# Patient Record
Sex: Female | Born: 1995 | Hispanic: No | Marital: Single | Smoking: Never smoker
Health system: Southern US, Community
[De-identification: ages and names within clinical notes are randomized; demographics above are authoritative.]

## PROBLEM LIST (undated history)

## (undated) DIAGNOSIS — D179 Benign lipomatous neoplasm, unspecified: Secondary | ICD-10-CM

## (undated) HISTORY — DX: Benign lipomatous neoplasm, unspecified: D17.9

---
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
## 2017-09-08 ENCOUNTER — Encounter: Payer: Self-pay | Admitting: Physician Assistant

## 2017-09-08 ENCOUNTER — Other Ambulatory Visit: Payer: Self-pay

## 2017-09-08 ENCOUNTER — Ambulatory Visit: Payer: Self-pay | Admitting: Physician Assistant

## 2017-09-08 ENCOUNTER — Ambulatory Visit (INDEPENDENT_AMBULATORY_CARE_PROVIDER_SITE_OTHER): Payer: Self-pay

## 2017-09-08 VITALS — BP 122/54 | HR 79 | Temp 98.8°F | Wt 162.2 lb

## 2017-09-08 DIAGNOSIS — R040 Epistaxis: Secondary | ICD-10-CM

## 2017-09-08 DIAGNOSIS — R042 Hemoptysis: Secondary | ICD-10-CM

## 2017-09-08 DIAGNOSIS — J019 Acute sinusitis, unspecified: Secondary | ICD-10-CM

## 2017-09-08 DIAGNOSIS — R519 Headache, unspecified: Secondary | ICD-10-CM

## 2017-09-08 DIAGNOSIS — R51 Headache: Secondary | ICD-10-CM

## 2017-09-08 DIAGNOSIS — J209 Acute bronchitis, unspecified: Secondary | ICD-10-CM

## 2017-09-08 LAB — POCT CBC
Granulocyte percent: 55.2 %G (ref 37–80)
HCT, POC: 35.3 % — AB (ref 37.7–47.9)
Hemoglobin: 11.8 g/dL — AB (ref 12.2–16.2)
Lymph, poc: 2.4 (ref 0.6–3.4)
MCH: 29.2 pg (ref 27–31.2)
MCHC: 33.4 g/dL (ref 31.8–35.4)
MCV: 87.3 fL (ref 80–97)
MID (CBC): 0.4 (ref 0–0.9)
MPV: 8.1 fL (ref 0–99.8)
PLATELET COUNT, POC: 261 10*3/uL (ref 142–424)
POC Granulocyte: 3.5 (ref 2–6.9)
POC LYMPH PERCENT: 37.9 %L (ref 10–50)
POC MID %: 6.9 %M (ref 0–12)
RBC: 4.04 M/uL (ref 4.04–5.48)
RDW, POC: 13.4 %
WBC: 6.4 10*3/uL (ref 4.6–10.2)

## 2017-09-08 MED ORDER — GUAIFENESIN ER 1200 MG PO TB12: 1.0000 | ORAL_TABLET | Freq: Two times a day (BID) | ORAL | 1 refills | Status: AC | PRN

## 2017-09-08 MED ORDER — AMOXICILLIN-POT CLAVULANATE 875-125 MG PO TABS: 1.0000 | ORAL_TABLET | Freq: Two times a day (BID) | ORAL | 0 refills | Status: AC

## 2017-09-08 NOTE — Progress Notes (Signed)
PRIMARY CARE AT [facility] [address], [place] [address] [phone]  Date:  09/08/2017   Name:  Kaylee Clark   DOB:  May 11, 1996   MRN:  [id number]  PCP:  Eloise Levels, MD    History of Present Illness:  Kaylee Clark is a 21 y.o. female patient who presents to PCP with  Chief Complaint  Patient presents with  . Sore Throat    bloody nose, blood in saliva for the past 2 wks, also headaches and some chest pain     Woke up with bloody nose, sore throat  Bloody nose nightly for the last 2 weeks.    She has a dry cough as well, but this morning developed dizziness and headaches.  She took ibuprofen for the headaches.  She has used dayquil to help her symptoms.   She had noticeable bloody nose.   Frontal headache with pressure.   She had subjective fever one day but this has dissipated.   She has no sob or dyspnea.   She feels that there is chest tightness.   No lower leg pain.   No hx of malignancy, calf pain, hospitalizations.  She is currently not on any birth control pills.   She denies any house dryness or using any space heaters.    Patient Active Problem List   Diagnosis Date Noted  . Muscle pain 10/21/2016  . RUQ abdominal pain and vomiting 04/10/2014  . Facial rash 01/11/2014  . Dandruff 10/16/2013  . 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 Wart 02/03/2013  . Light headedness 02/17/2011  . 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 Hibernoma 2014   left sided    Past Surgical History:  Procedure Laterality Date  . SOFT TISSUE TUMOR RESECTION Left 2014   HIbernoma, resected by Dr. Lavell Anchors at [place] Use  . Smoking status: Never Smoker  . Smokeless tobacco: Never Used  Substance Use Topics  . Alcohol use: No  . Drug use: No    History reviewed. No pertinent family history.  No Known Allergies  Medication list has been reviewed and updated.  Current Outpatient  Medications on File Prior to Visit  Medication Sig Dispense Refill  . meloxicam (MOBIC) 15 MG tablet Take 1 tablet (15 mg total) by mouth daily. For 10 days, then daily as need for pain after that 30 tablet 0   No current facility-administered medications on file prior to visit.     ROS ROS otherwise unremarkable unless listed above.  Physical Examination: BP (!) 122/54 (BP Location: Left Arm, Patient Position: Sitting, Cuff Size: Normal)   Pulse 79   Temp 98.8 F (37.1 C) (Oral)   Wt 162 lb 3.2 oz (73.6 kg)   SpO2 99%   BMI 26.99 kg/m  Ideal Body Weight:    Physical Exam  Constitutional: She is oriented to person, place, and time. She appears well-developed and well-nourished. No distress.  HENT:  Head: Normocephalic and atraumatic.  Right Ear: Tympanic membrane, external ear and ear canal normal.  Left Ear: Tympanic membrane, external ear and ear canal normal.  Nose: Mucosal edema and rhinorrhea present. Right sinus exhibits no maxillary sinus tenderness and no frontal sinus tenderness. Left sinus exhibits no maxillary sinus tenderness and no frontal sinus tenderness.  Mouth/Throat: No uvula swelling. No oropharyngeal exudate, posterior oropharyngeal edema or posterior oropharyngeal erythema.  Eyes: Conjunctivae and EOM are normal.  Pupils are equal, round, and reactive to light.  Cardiovascular: Normal rate and regular rhythm. Exam reveals no gallop, no distant heart sounds and no friction rub.  No murmur heard. Pulmonary/Chest: Effort normal. No respiratory distress. She has no decreased breath sounds. She has no wheezes. She has no rhonchi.  Lymphadenopathy:       Head (right side): No submandibular, no tonsillar, no preauricular and no posterior auricular adenopathy present.       Head (left side): No submandibular, no tonsillar, no preauricular and no posterior auricular adenopathy present.  Neurological: She is alert and oriented to person, place, and time.  Skin: She is  not diaphoretic.  Psychiatric: She has a normal mood and affect. Her behavior is normal.   Dg Chest 2 View  Result Date: 09/09/2017 CLINICAL DATA:  Productive cough which has recently become productive over the past 2 weeks associated with chest tightness. Patient also reports nasal congestion and bloody nasal drainage. EXAM: CHEST  2 VIEW COMPARISON:  Chest x-ray report of October 24, 1998. FINDINGS: The lungs are adequately inflated. There is no focal infiltrate. The interstitial markings are mildly prominent bilaterally. There is no pleural effusion. The heart and pulmonary vascularity are normal. The mediastinum is normal in width. The bony thorax is unremarkable. The (shadow is not well demonstrated. IMPRESSION: There is no acute pneumonia. Mild interstitial prominence may reflect acute bronchitic change. Electronically Signed   By: David  Martinique M.D.   On: 09/09/2017 10:20     Assessment and Plan: Kaylee Clark is a 21 y.o. female who is here today for cc of  Chief Complaint  Patient presents with  . Sore Throat    bloody nose, blood in saliva for the past 2 wks, also headaches and some chest pain   Acute sinusitis, recurrence not specified, unspecified location - Plan: amoxicillin-clavulanate (AUGMENTIN) 875-125 MG tablet  Acute bronchitis, unspecified organism  Bloody sputum - Plan: POCT CBC, DG Chest 2 View  Bleeding from the nose - Plan: POCT CBC, DG Chest 2 View  Acute nonintractable headache, unspecified headache type - Plan: POCT CBC, DG Chest 2 View  Ivar Drape, PA-C [facility] 12/15/20183:33 PM

## 2017-09-08 NOTE — Patient Instructions (Addendum)
I would like you to use a humidifier in your room temporarily. Please take antibiotic as prescribed. You can take ibuprofen for pain.   Also use nasal saline in the nostrils.  You can get this from any pharmacy.    Sinusitis, Adult Sinusitis is soreness and inflammation of your sinuses. Sinuses are hollow spaces in the bones around your face. They are located:  Around your eyes.  In the middle of your forehead.  Behind your nose.  In your cheekbones.  Your sinuses and nasal passages are lined with a stringy fluid (mucus). Mucus normally drains out of your sinuses. When your nasal tissues get inflamed or swollen, the mucus can get trapped or blocked so air cannot flow through your sinuses. This lets bacteria, viruses, and funguses grow, and that leads to infection. Follow these instructions at home: Medicines  Take, use, or apply over-the-counter and prescription medicines only as told by your doctor. These may include nasal sprays.  If you were prescribed an antibiotic medicine, take it as told by your doctor. Do not stop taking the antibiotic even if you start to feel better. Hydrate and Humidify  Drink enough water to keep your pee (urine) clear or pale yellow.  Use a cool mist humidifier to keep the humidity level in your home above 50%.  Breathe in steam for 10-15 minutes, 3-4 times a day or as told by your doctor. You can do this in the bathroom while a hot shower is running.  Try not to spend time in cool or dry air. Rest  Rest as much as possible.  Sleep with your head raised (elevated).  Make sure to get enough sleep each night. General instructions  Put a warm, moist washcloth on your face 3-4 times a day or as told by your doctor. This will help with discomfort.  Wash your hands often with soap and water. If there is no soap and water, use hand sanitizer.  Do not smoke. Avoid being around people who are smoking (secondhand smoke).  Keep all follow-up visits as  told by your doctor. This is important. Contact a doctor if:  You have a fever.  Your symptoms get worse.  Your symptoms do not get better within 10 days. Get help right away if:  You have a very bad headache.  You cannot stop throwing up (vomiting).  You have pain or swelling around your face or eyes.  You have trouble seeing.  You feel confused.  Your neck is stiff.  You have trouble breathing. This information is not intended to replace advice given to you by your health care provider. Make sure you discuss any questions you have with your health care provider. Document Released: 03/02/2008 Document Revised: 05/10/2016 Document Reviewed: 07/10/2015 Elsevier Interactive Patient Education  2018 [employer].     IF you received an x-ray today, you will receive an invoice from [facility]. Please contact [facility] at [phone] with questions or concerns regarding your invoice.   IF you received labwork today, you will receive an invoice from [place]. Please contact [facility] at [phone] with questions or concerns regarding your invoice.   Our billing staff will not be able to assist you with questions regarding bills from these companies.  You will be contacted with the lab results as soon as they are available. The fastest way to get your results is to activate your My Chart account. Instructions are located on the last page of this paperwork. If you have not heard  from [place] regarding the results in 2 weeks, please contact this office.

## 2017-09-14 ENCOUNTER — Encounter: Payer: Self-pay | Admitting: Physician Assistant

## 2018-01-05 ENCOUNTER — Encounter: Payer: Self-pay | Admitting: Physician Assistant

## 2019-09-21 IMAGING — DX DG CHEST 2V
2 series · 2 of 2 positions shown · non-contrast
Comparison: Chest x-ray report October 24, 1998.

CLINICAL DATA: Productive cough which has recently become
productive over the past 2 weeks associated with chest tightness.
Patient also reports nasal congestion and bloody nasal drainage.

EXAM:
CHEST  2 VIEW

[chest pa]
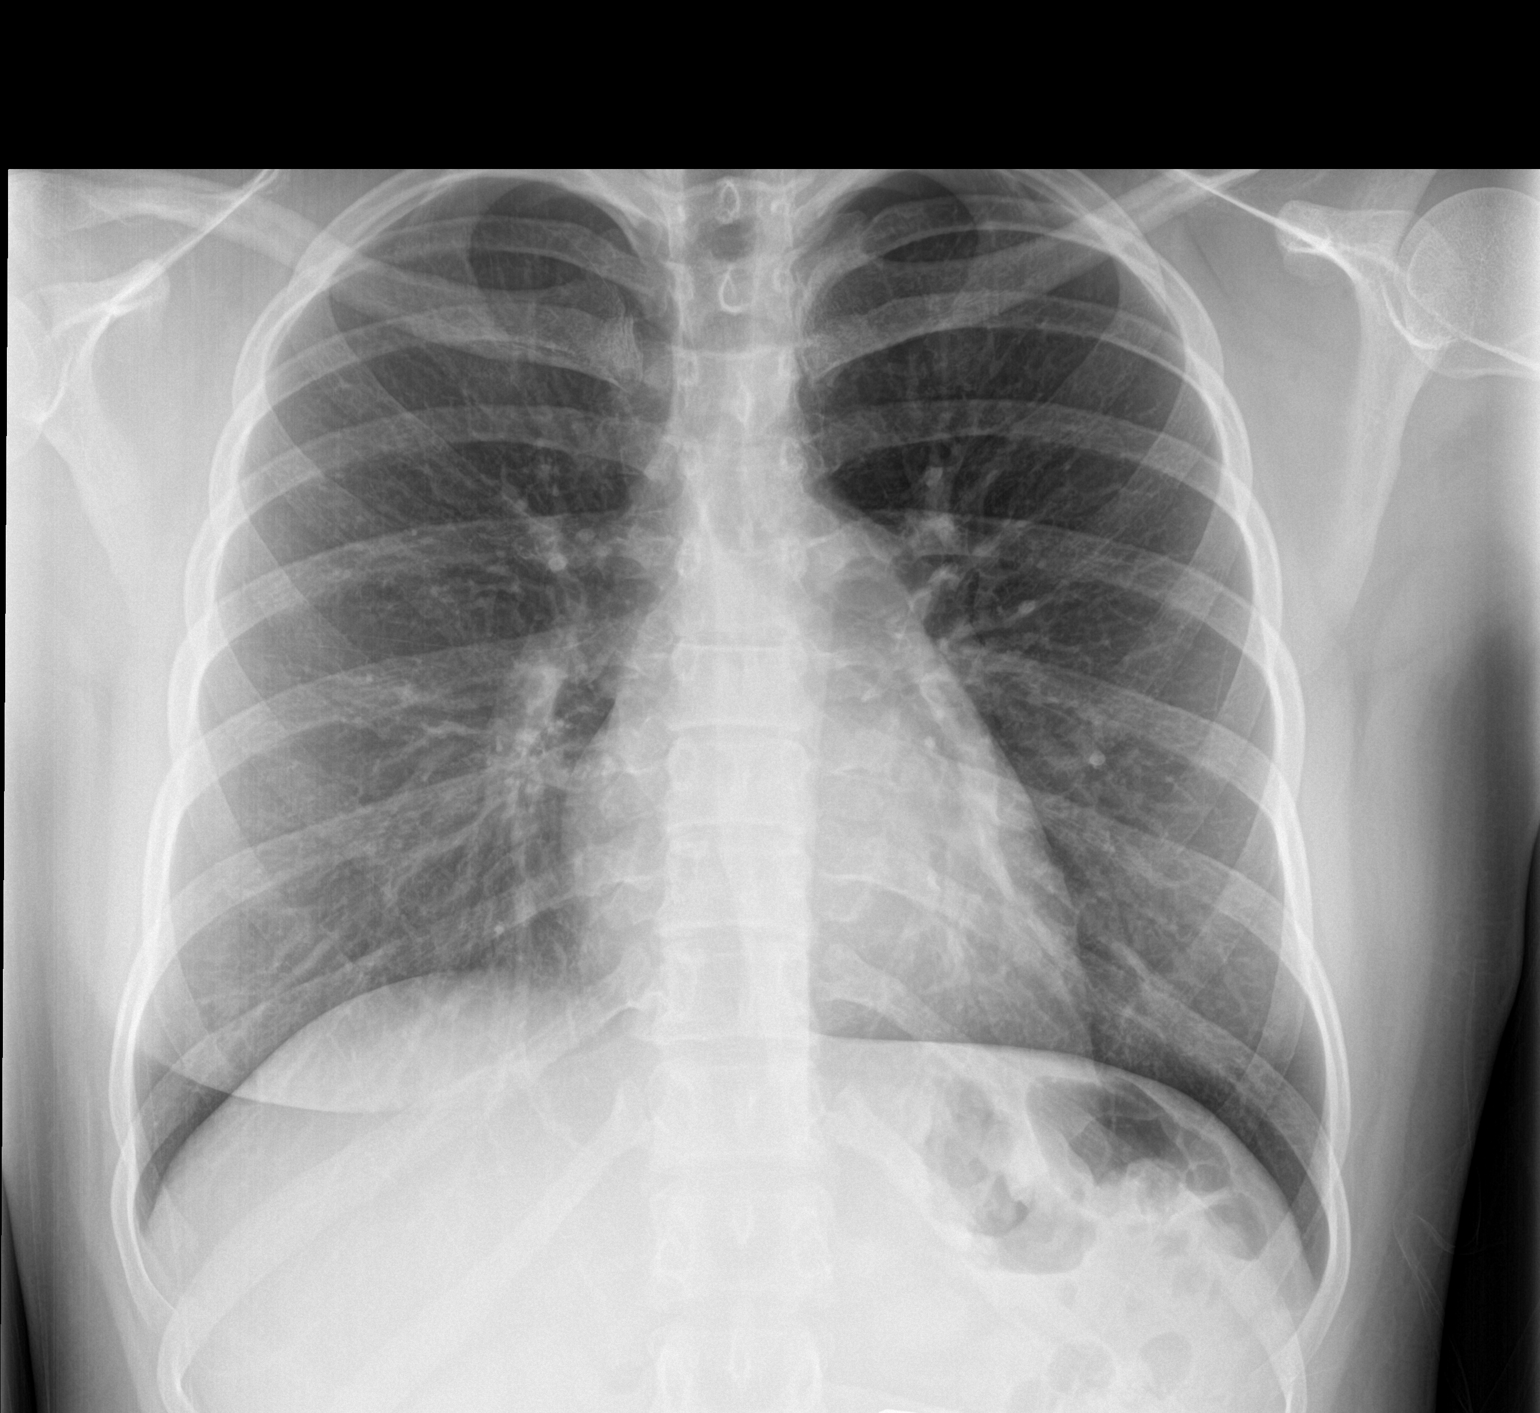

[chest lat]
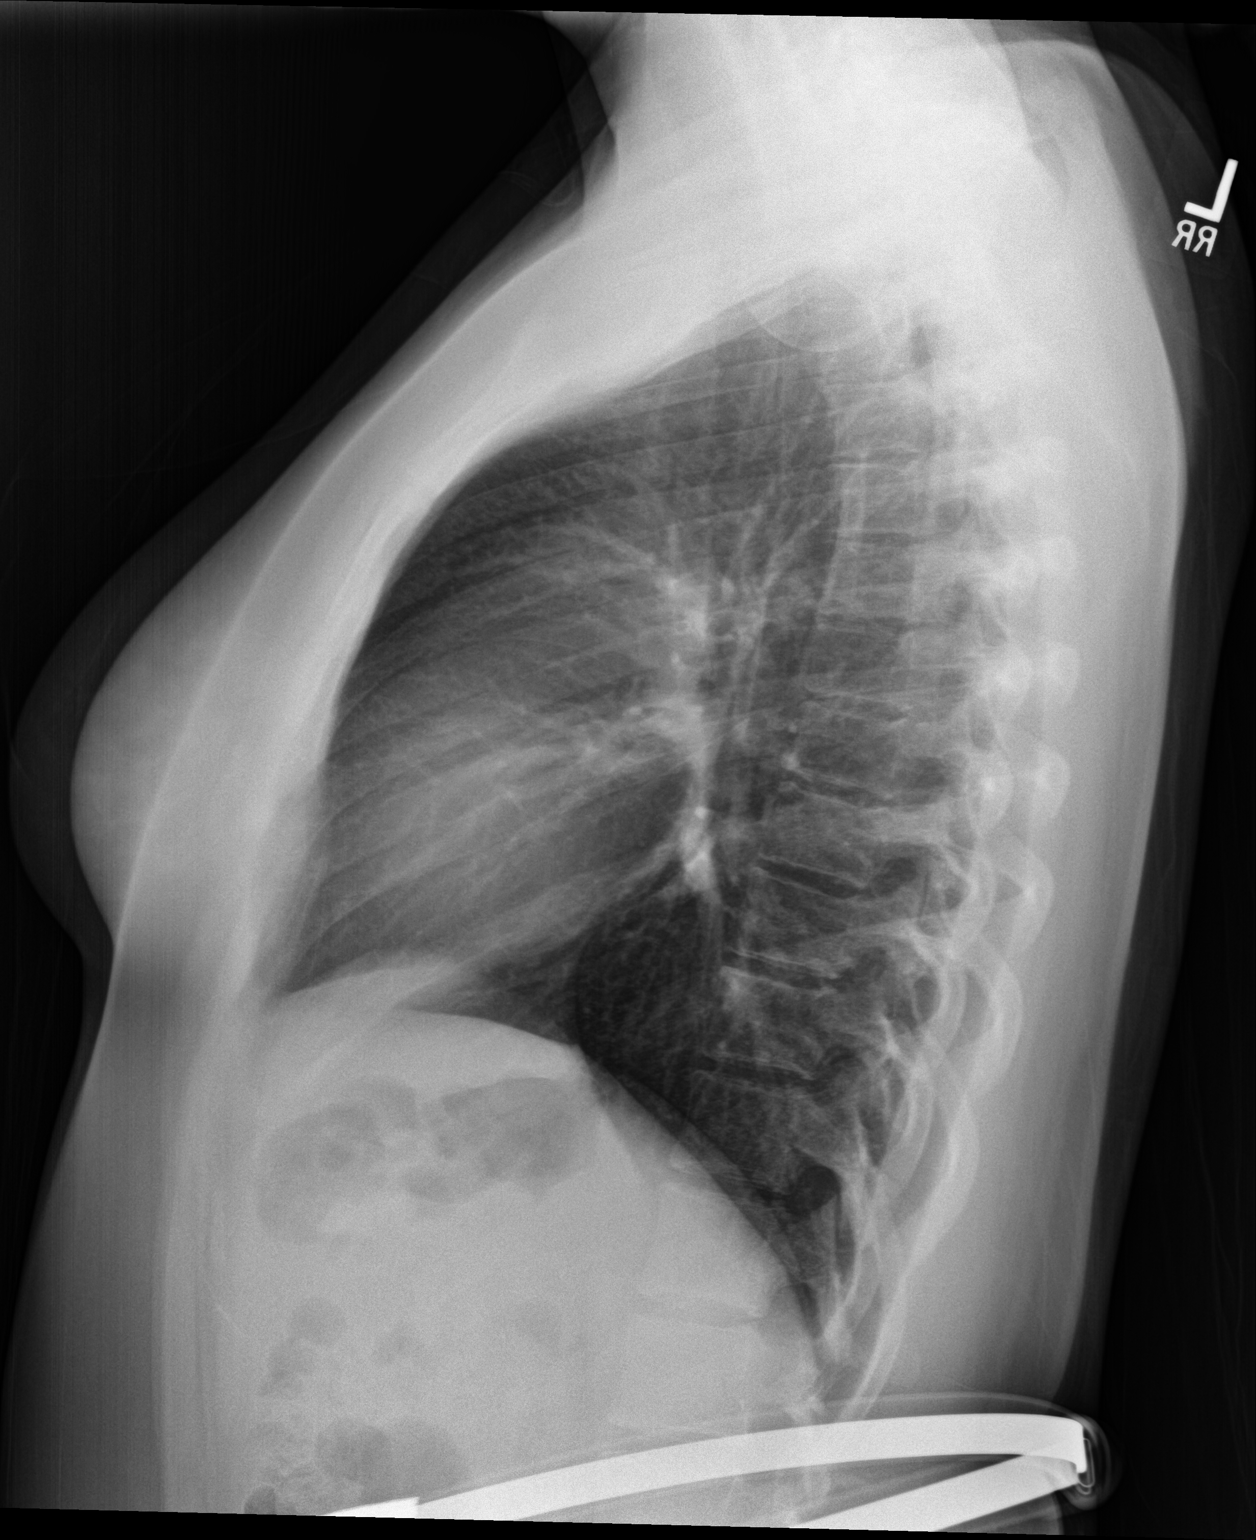

[2 of 2 positions shown; findings below may reference images not displayed]

FINDINGS: The lungs are adequately inflated. There is no focal infiltrate. The
interstitial markings are mildly prominent bilaterally. There is no
pleural effusion. The heart and pulmonary vascularity are normal.
The mediastinum is normal in width. The bony thorax is unremarkable.
The (shadow is not well demonstrated.
IMPRESSION: There is no acute pneumonia. Mild interstitial prominence may
reflect acute bronchitic change.

## 2022-10-19 ENCOUNTER — Telehealth: Payer: Self-pay

## 2022-10-19 NOTE — Telephone Encounter (Signed)
Sending mychart msg.
